# Patient Record
Sex: Male | Born: 2000 | Race: White | Hispanic: No | Marital: Single | Smoking: Never smoker
Health system: Southern US, Community
[De-identification: ages and names within clinical notes are randomized; demographics above are authoritative.]

---
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
## 2021-03-01 ENCOUNTER — Other Ambulatory Visit: Payer: Self-pay

## 2021-03-01 ENCOUNTER — Encounter: Payer: Self-pay | Admitting: Emergency Medicine

## 2021-03-01 ENCOUNTER — Emergency Department
Admission: EM | Admit: 2021-03-01 | Discharge: 2021-03-01 | Disposition: A | Discharge status: Home / Self Care | Payer: BC Managed Care – PPO | Attending: Emergency Medicine | Admitting: Emergency Medicine

## 2021-03-01 DIAGNOSIS — H16133 Photokeratitis, bilateral: Secondary | ICD-10-CM

## 2021-03-01 DIAGNOSIS — H16131 Photokeratitis, right eye: Secondary | ICD-10-CM | POA: Diagnosis not present

## 2021-03-01 DIAGNOSIS — H16132 Photokeratitis, left eye: Secondary | ICD-10-CM | POA: Insufficient documentation

## 2021-03-01 DIAGNOSIS — H5712 Ocular pain, left eye: Secondary | ICD-10-CM | POA: Diagnosis present

## 2021-03-01 MED ORDER — TETRACAINE HCL 0.5 % OP SOLN
2.0000 [drp] | Freq: Once | OPHTHALMIC | Status: AC
  Administered 2021-03-01: 2 [drp] via OPHTHALMIC
  Filled 2021-03-01: qty 4

## 2021-03-01 MED ORDER — OXYCODONE-ACETAMINOPHEN 5-325 MG PO TABS
2.0000 | ORAL_TABLET | Freq: Once | ORAL | Status: AC
  Administered 2021-03-01: 2 via ORAL
  Filled 2021-03-01: qty 2

## 2021-03-01 MED ORDER — ERYTHROMYCIN 5 MG/GM OP OINT
TOPICAL_OINTMENT | OPHTHALMIC | Status: AC
  Administered 2021-03-01: 1 via OPHTHALMIC
  Filled 2021-03-01: qty 1

## 2021-03-01 MED ORDER — OXYCODONE-ACETAMINOPHEN 5-325 MG PO TABS: 2.0000 | ORAL_TABLET | Freq: Four times a day (QID) | ORAL | 0 refills | Status: DC | PRN

## 2021-03-01 MED ORDER — FLUORESCEIN SODIUM 1 MG OP STRP
1.0000 | ORAL_STRIP | Freq: Once | OPHTHALMIC | Status: AC
  Administered 2021-03-01: 1 via OPHTHALMIC
  Filled 2021-03-01: qty 1

## 2021-03-01 MED ORDER — ERYTHROMYCIN 5 MG/GM OP OINT: TOPICAL_OINTMENT | OPHTHALMIC | 0 refills | Status: AC

## 2021-03-01 NOTE — ED Provider Notes (Signed)
[facility] Provider Note    Event Date/Time   First MD Initiated Contact with Patient 03/01/21 0153     (approximate)   History   Eye Problem   HPI  Albert Nichols is a 21 y.o. male who reports no chronic medical issues and presents for evaluation of bilateral severe eye pain, redness, and foreign body sensation worse on the right than the left.  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  He felt fine at the time but several hours later he developed severe pain which has become worse and is almost unbearable.  Nothing in particular makes it better or worse although closing his eyes helps a little bit.  He does not remember getting anything in his eyes but he is worried that something may have gotten into his right eye.  No nausea or vomiting, no trauma.     Physical Exam   Triage Vital Signs: ED Triage Vitals  Enc Vitals Group     BP 03/01/21 0147 (!) 107/56     Pulse Rate 03/01/21 0147 71     Resp 03/01/21 0147 16     Temp 03/01/21 0147 98.2 F (36.8 C)     Temp src --      SpO2 03/01/21 0147 98 %     Weight 03/01/21 0140 65.8 kg (145 lb)     Height 03/01/21 0140 1.702 m (5\' 7" )     Head Circumference --      Peak Flow --      Pain Score 03/01/21 0143 10     Pain Loc --      Pain Edu? --      Excl. in [place]? --     Most recent vital signs: Vitals:   03/01/21 0147  BP: (!) 107/56  Pulse: 71  Resp: 16  Temp: 98.2 F (36.8 C)  SpO2: 98%     General: Awake, no distress although he appears a bit uncomfortable. Eyes:  Bilateral conjunctival injection.  Pupils are equal and reactive.  Examination with Woods lamp magnification under normal light and under black light after fluorescein administration reveals no foreign bodies, no corneal abrasions, no corneal ulcers. CV:  Good peripheral perfusion.  Resp:  Normal effort.    ED  Results / Procedures / Treatments    MEDICATIONS ORDERED IN ED: Medications  tetracaine (PONTOCAINE) 0.5 % ophthalmic solution 2 drop (2 drops Both Eyes Given by Other 03/01/21 0245)  fluorescein ophthalmic strip 1 strip (1 strip Both Eyes Given by Other 03/01/21 0245)  oxyCODONE-acetaminophen (PERCOCET/ROXICET) 5-325 MG per tablet 2 tablet (2 tablets Oral Given 03/01/21 0317)  erythromycin ophthalmic ointment (1 application Both Eyes Given 03/01/21 0318)     IMPRESSION / MDM / ASSESSMENT AND PLAN / ED COURSE  I reviewed the triage vital signs and the nursing notes.                              Differential diagnosis includes, but is not limited to, photokeratitis, conjunctivitis, uveitis/iritis, foreign body.  History does not support foreign body but does support photokeratitis.  Vital signs are stable.  Patient is obviously uncomfortable but felt great relief after tetracaine administration.  I also provided to oxycodone by mouth.  No evidence of corneal abrasion.  Patient has photokeratitis and I provided erythromycin ophthalmic  ointment and recommended close outpatient follow-up at the [facility].  I had my usual and customary photokeratitis discussion with the patient and his partner (girlfriend or wife) and they will follow-up as an outpatient.  I given work note.  I gave my usual and customary return precautions.           FINAL CLINICAL IMPRESSION(S) / ED DIAGNOSES   Final diagnoses:  Photokeratitis of both eyes     Rx / DC Orders   ED Discharge Orders          Ordered    erythromycin ophthalmic ointment        03/01/21 0336    oxyCODONE-acetaminophen (PERCOCET) 5-325 MG tablet  Every 6 hours PRN        03/01/21 0336             Note:  This document was prepared using Dragon voice recognition software and may include unintentional dictation errors.   Hinda Kehr, MD 03/01/21 [phone]

## 2021-03-01 NOTE — ED Triage Notes (Signed)
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

## 2021-03-01 NOTE — Discharge Instructions (Signed)
Please use the provided eye ointment up to 4 times a day for comfort.  Continue using it for about 3 days or until your pain is better.  You can also use over-the-counter saline drops (make sure there are no preservatives or other ingredients).  Use over-the-counter ibuprofen and/or Tylenol for pain control, or Take Perococet as prescribed for severe pain. Do not drink alcohol, drive or participate in any other potentially dangerous activities while taking this medication as it may make you sleepy. Do not take this medication with any other sedating medications, either prescription or over-the-counter. If you were prescribed Percocet or Vicodin, do not take these with acetaminophen (Tylenol) as it is already contained within these medications.   This medication is an opiate (or narcotic) pain medication and can be habit forming.  Use it as little as possible to achieve adequate pain control.  Do not use or use it with extreme caution if you have a history of opiate abuse or dependence.  If you are on a pain contract with your primary care doctor or a pain specialist, be sure to let them know you were prescribed this medication today from the [facility] Emergency Department.  This medication is intended for your use only - do not give any to anyone else and keep it in a secure place where nobody else, especially children, have access to it.  It will also cause or worsen constipation, so you may want to consider taking an over-the-counter stool softener while you are taking this medication.  It is important that you call in the morning to schedule a follow-up appointment at the [facility] with Dr. Druscilla Brownie or one of his colleagues.  You should be feeling much better within 1 to 3 days, but it is important to have an expert to look at your eyes as well.

## 2021-11-27 ENCOUNTER — Other Ambulatory Visit: Payer: Self-pay

## 2021-11-27 ENCOUNTER — Ambulatory Visit (HOSPITAL_COMMUNITY)
Admission: EM | Admit: 2021-11-27 | Discharge: 2021-11-27 | Disposition: A | Discharge status: Home / Self Care | Payer: Medicaid Other | Attending: Emergency Medicine | Admitting: Emergency Medicine

## 2021-11-27 ENCOUNTER — Encounter (HOSPITAL_COMMUNITY): Payer: Self-pay | Admitting: *Deleted

## 2021-11-27 DIAGNOSIS — S29012A Strain of muscle and tendon of back wall of thorax, initial encounter: Secondary | ICD-10-CM | POA: Diagnosis not present

## 2021-11-27 MED ORDER — KETOROLAC TROMETHAMINE 30 MG/ML IJ SOLN
30.0000 mg | Freq: Once | INTRAMUSCULAR | Status: AC
  Administered 2021-11-27: 30 mg via INTRAMUSCULAR

## 2021-11-27 MED ORDER — PREDNISONE 20 MG PO TABS: 20.0000 mg | ORAL_TABLET | Freq: Every day | ORAL | 0 refills | Status: AC

## 2021-11-27 MED ORDER — TIZANIDINE HCL 4 MG PO TABS: 4.0000 mg | ORAL_TABLET | Freq: Four times a day (QID) | ORAL | 0 refills | Status: AC | PRN

## 2021-11-27 MED ORDER — KETOROLAC TROMETHAMINE 30 MG/ML IJ SOLN
INTRAMUSCULAR | Status: AC
  Filled 2021-11-27: qty 1

## 2021-11-27 NOTE — ED Triage Notes (Signed)
Pt reports last week when he was driving home he got really mad and screamed. Pt reports he felt something pop in his upper back and has pain between his shoulder blades since then.

## 2021-11-27 NOTE — Discharge Instructions (Addendum)
I think you have a strain of the back muscle.  We have given you a Toradol injection in office today.  Zanaflex as a muscle relaxant has been sent to the pharmacy, you can take this every 6 hours as needed, please be mindful that this medication can make you sleepy, I would advise that you do not operate any heavy machinery or drive a car after taking this medication.  Prednisone, is a steroid this was sent to the pharmacy, you will take this medication in the morning for the next 5 days.  This medication can cause you to have difficulty sleeping at night so I advised that you take this medication first thing in the morning.   I advised that you stay home rest for the next couple days.  I have attached information in the back of your paperwork for how you can use ice on the site of pain.  I have attached information to your discharge paperwork for [facility] in the case that symptoms do not improve, you can follow-up with this office.

## 2021-11-27 NOTE — ED Provider Notes (Signed)
[facility]    CSN: [id number] Arrival date & time: 11/27/21  9794      History   Chief Complaint Chief Complaint  Patient presents with   Back Pain    HPI Albert Nichols is a 21 y.o. male.  Patient presents complaining of left upper back pain x 1 week.  Patient denies any fall or trauma .  Patient denies any change to bowel or bladder patterns. Patient reports onset of symptoms began after yelling in a car and then experiencing sharp pain.  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  Patient reports after working this weekend pain became worsened.  Patient has taken an over-the-counter anti-inflammatory with no relief of symptoms.    Back Pain Associated symptoms: no chest pain, no fever, no numbness and no weakness     History reviewed. No pertinent past medical history.  There are no problems to display for this patient.   History reviewed. No pertinent surgical history.     Home Medications    Prior to Admission medications   Medication Sig Start Date End Date Taking? Authorizing Provider  predniSONE (DELTASONE) 20 MG tablet Take 1 tablet (20 mg total) by mouth daily. 11/27/21  Yes Debby Freiberg, NP  tiZANidine (ZANAFLEX) 4 MG tablet Take 1 tablet (4 mg total) by mouth every 6 (six) hours as needed for muscle spasms. 11/27/21  Yes Debby Freiberg, NP  erythromycin ophthalmic ointment Apply a thin strip of ointment (about 1/4") to the lower eyelid of each affected eye every 6 hours for 3 days or until your pain has resolved 03/01/21   Loleta Rose, MD  oxyCODONE-acetaminophen (PERCOCET) 5-325 MG tablet Take 2 tablets by mouth every 6 (six) hours as needed for severe pain. 03/01/21   Loleta Rose, MD    Family History History reviewed. No pertinent family history.  Social History Social History   Tobacco Use   Smoking status: Never   Smokeless tobacco: Never     Allergies   Patient has no  known allergies.   Review of Systems Review of Systems  Constitutional:  Positive for activity change (Due to pain). Negative for chills, fatigue and fever.  Respiratory: Negative.    Cardiovascular:  Negative for chest pain.  Gastrointestinal:  Negative for nausea and vomiting.  Genitourinary: Negative.   Musculoskeletal:  Positive for back pain and myalgias. Negative for arthralgias, gait problem, joint swelling, neck pain and neck stiffness.       LFT upper back pain   Neurological:  Negative for weakness and numbness.     Physical Exam Triage Vital Signs ED Triage Vitals  Enc Vitals Group     BP 11/27/21 1016 110/71     Pulse Rate 11/27/21 1016 (!) 48     Resp 11/27/21 1016 20     Temp 11/27/21 1016 97.8 F (36.6 C)     Temp src --      SpO2 11/27/21 1016 99 %     Weight --      Height --      Head Circumference --      Peak Flow --      Pain Score 11/27/21 1012 10     Pain Loc --      Pain Edu? --      Excl. in [place]? --    No data found.  Updated Vital Signs BP 110/71   Pulse (!) 48  Temp 97.8 F (36.6 C)   Resp 20   SpO2 99%      Physical Exam Vitals and nursing note reviewed.  Musculoskeletal:     Left shoulder: Normal range of motion.       Arms:     Cervical back: Spasms and tenderness present. No swelling, edema, deformity, erythema, rigidity, torticollis or bony tenderness. Pain with movement present. Normal range of motion.     Thoracic back: Normal.     Lumbar back: Normal.       Back:     Comments: Sharp pain localized in region, tenderness upon palpation of LFT upper back , under scapula. Pain reproduced in LFT upper back under scapula with abduction and flexion of LFT shoulder.   Neurological:     General: No focal deficit present.     Mental Status: He is alert.     Motor: Motor function is intact.  Psychiatric:        Behavior: Behavior is cooperative.      UC Treatments / Results  Labs (all labs ordered are listed, but only  abnormal results are displayed) Labs Reviewed - No data to display  EKG   Radiology No results found.  Procedures Procedures (including critical care time)  Medications Ordered in UC Medications  ketorolac (TORADOL) 30 MG/ML injection 30 mg (30 mg Intramuscular Given 11/27/21 1135)    Initial Impression / Assessment and Plan / UC Course  I have reviewed the triage vital signs and the nursing notes.  Pertinent labs & imaging results that were available during my care of the patient were reviewed by me and considered in my medical decision making (see chart for details).     Patient was treated for muscle sprain in the left upper back.  Toradol injection was given in office.  Shared decision-making was used to determine an x-ray was not necessary at this time due to symptomology and mechanism of injury.  Zanaflex and prednisone prescription was sent to pharmacy.  Patient was made aware of possible side effects with these medications.  [facility] information was given in the case that symptoms do not improve.  Patient was made aware to rest back for the next couple days.  Patient verbalized understanding of instructions.  Final Clinical Impressions(s) / UC Diagnoses   Final diagnoses:  Muscle strain of left upper back, initial encounter     Discharge Instructions      I think you have a strain of the back muscle.  We have given you a Toradol injection in office today.  Zanaflex as a muscle relaxant has been sent to the pharmacy, you can take this every 6 hours as needed, please be mindful that this medication can make you sleepy, I would advise that you do not operate any heavy machinery or drive a car after taking this medication.  Prednisone, is a steroid this was sent to the pharmacy, you will take this medication in the morning for the next 5 days.  This medication can cause you to have difficulty sleeping at night so I advised that you take this medication first thing in the  morning.   I advised that you stay home rest for the next couple days.  I have attached information in the back of your paperwork for how you can use ice on the site of pain.  I have attached information to your discharge paperwork for [facility] in the case that symptoms do not improve, you can follow-up with this office.  ED Prescriptions     Medication Sig Dispense Auth. Provider   tiZANidine (ZANAFLEX) 4 MG tablet Take 1 tablet (4 mg total) by mouth every 6 (six) hours as needed for muscle spasms. 30 tablet Debby Freiberg, NP   predniSONE (DELTASONE) 20 MG tablet Take 1 tablet (20 mg total) by mouth daily. 5 tablet Debby Freiberg, NP      PDMP not reviewed this encounter.   Debby Freiberg, NP 11/27/21 1151

## 2021-12-08 ENCOUNTER — Emergency Department (HOSPITAL_COMMUNITY)
Admission: EM | Admit: 2021-12-08 | Discharge: 2021-12-09 | Disposition: A | Discharge status: Home / Self Care | Payer: 59 | Attending: Emergency Medicine | Admitting: Emergency Medicine

## 2021-12-08 ENCOUNTER — Encounter (HOSPITAL_COMMUNITY): Payer: Self-pay | Admitting: Emergency Medicine

## 2021-12-08 ENCOUNTER — Emergency Department (HOSPITAL_COMMUNITY): Payer: 59

## 2021-12-08 ENCOUNTER — Other Ambulatory Visit: Payer: Self-pay

## 2021-12-08 DIAGNOSIS — N451 Epididymitis: Secondary | ICD-10-CM | POA: Insufficient documentation

## 2021-12-08 DIAGNOSIS — N50812 Left testicular pain: Secondary | ICD-10-CM | POA: Diagnosis present

## 2021-12-08 LAB — URINALYSIS, ROUTINE W REFLEX MICROSCOPIC
Bilirubin Urine: NEGATIVE
Glucose, UA: NEGATIVE mg/dL
Ketones, ur: NEGATIVE mg/dL
Nitrite: NEGATIVE
Protein, ur: 30 mg/dL — AB
Specific Gravity, Urine: 1.012 (ref 1.005–1.030)
WBC, UA: >50 WBC/hpf — ABNORMAL HIGH (ref 0–5)
pH: 8 (ref 5.0–8.0)

## 2021-12-08 NOTE — ED Provider Triage Note (Signed)
Emergency Medicine Provider Triage Evaluation Note  Albert Nichols , a 21 y.o. male  was evaluated in triage.  Pt complains of left testicular pain x 2 days. States he woke up with the pain and it has been worsening. Thinks he had some hematuria today.   Review of Systems  Positive: Testicular pain, hematuria Negative: Dysuria, fever, abd pain  Physical Exam  BP 131/67   Pulse 83   Temp 98.5 F (36.9 C)   Resp (!) 22   SpO2 100%  Gen:   Awake, no distress   Resp:  Normal effort  MSK:   Moves extremities without difficulty  Other:    Medical Decision Making  Medically screening exam initiated at 8:51 PM.  Appropriate orders placed.  Jessen Siegman was informed that the remainder of the evaluation will be completed by another provider, this initial triage assessment does not replace that evaluation, and the importance of remaining in the ED until their evaluation is complete.  Workup initiated including scrotal doppler US to r/o torsion   Halli Equihua T, PA-C 12/08/21 2051

## 2021-12-08 NOTE — ED Triage Notes (Signed)
Patient reports worsening left testicular pain onset Wednesday this week with hematuria today .

## 2021-12-09 DIAGNOSIS — N451 Epididymitis: Secondary | ICD-10-CM | POA: Diagnosis not present

## 2021-12-09 MED ORDER — CEFTRIAXONE SODIUM 500 MG IJ SOLR
500.0000 mg | Freq: Once | INTRAMUSCULAR | Status: AC
  Administered 2021-12-09: 500 mg via INTRAMUSCULAR
  Filled 2021-12-09: qty 500

## 2021-12-09 MED ORDER — LIDOCAINE HCL (PF) 1 % IJ SOLN
INTRAMUSCULAR | Status: AC
  Administered 2021-12-09: 1 mL
  Filled 2021-12-09: qty 5

## 2021-12-09 MED ORDER — DOXYCYCLINE HYCLATE 100 MG PO CAPS: 100.0000 mg | ORAL_CAPSULE | Freq: Two times a day (BID) | ORAL | 0 refills | Status: AC

## 2021-12-09 MED ORDER — DOXYCYCLINE HYCLATE 100 MG PO TABS
100.0000 mg | ORAL_TABLET | Freq: Once | ORAL | Status: AC
  Administered 2021-12-09: 100 mg via ORAL
  Filled 2021-12-09: qty 1

## 2021-12-09 NOTE — ED Provider Notes (Signed)
[facility] Emergency Department Provider Note MRN:  [id number]  Arrival date & time: 12/09/21     Chief Complaint   Left Testicle Pain / Hematuria   History of Present Illness   Albert Nichols. is a 21 y.o. year-old male presents to the ED with chief complaint of left testicle pain, dysuria, and penile discharge.  Noticed the symptoms 3 days ago.  Is sexually active, but denies new partners.  Denies fever or chills.  Denies any other associated symptoms.  History provided by patient.   Review of Systems  Pertinent positive and negative review of systems noted in HPI.    Physical Exam   Vitals:   12/09/21 0205 12/09/21 0442  BP:  126/74  Pulse:  70  Resp:  18  Temp: 99 F (37.2 C) 98.8 F (37.1 C)  SpO2:  100%    CONSTITUTIONAL:  well-appearing, NAD NEURO:  Alert and oriented x 3, CN 3-12 grossly intact EYES:  eyes equal and reactive ENT/NECK:  Supple, no stridor  CARDIO:  appears well-perfused  PULM:  No respiratory distress,  GI/GU:  non-distended, mild left testicular swelling and TTP, no masses, sores, or lesions MSK/SPINE:  No gross deformities, no edema, moves all extremities  SKIN:  no rash, atraumatic   *Additional and/or pertinent findings included in MDM below  Diagnostic and Interventional Summary     Labs Reviewed  URINALYSIS, ROUTINE W REFLEX MICROSCOPIC - Abnormal; Notable for the following components:      Result Value   APPearance CLOUDY (*)    Hgb urine dipstick SMALL (*)    Protein, ur 30 (*)    Leukocytes,Ua LARGE (*)    WBC, UA >50 (*)    Bacteria, UA RARE (*)    All other components within normal limits    US SCROTUM W/DOPPLER  Final Result      Medications  cefTRIAXone (ROCEPHIN) injection 500 mg (500 mg Intramuscular Given 12/09/21 0451)  doxycycline (VIBRA-TABS) tablet 100 mg (100 mg Oral Given 12/09/21 0451)  lidocaine (PF) (XYLOCAINE) 1 % injection (1 mL  Given 12/09/21 0452)      Procedures  /  Critical Care Procedures  ED Course and Medical Decision Making  I have reviewed the triage vital signs, the nursing notes, and pertinent available records from the EMR.  Social Determinants Affecting Complexity of Care: Patient has no clinically significant social determinants affecting this chief complaint..   ED Course:    Medical Decision Making Patient here with testicle pain.  [place] and UA ordered in triage consistent with epididymitis.  Likely 2/2 to gonorrhea or chlamydia.  Will treat with rocephin and doxy.  Recommend scrotal support.  No evidence of torsion.  Risk Prescription drug management.     Consultants: No consultations were needed in caring for this patient.   Treatment and Plan: Emergency department workup does not suggest an emergent condition requiring admission or immediate intervention beyond  what has been performed at this time. The patient is safe for discharge and has  been instructed to return immediately for worsening symptoms, change in  symptoms or any other concerns    Final Clinical Impressions(s) / ED Diagnoses     ICD-10-CM   1. Epididymitis  N45.1       ED Discharge Orders          Ordered    doxycycline (VIBRAMYCIN) 100 MG capsule  2 times daily        12/09/21 0505  Discharge Instructions Discussed with and Provided to Patient:   Discharge Instructions   None      Roxy Horseman, PA-C 12/09/21 3833    Mesner, Barbara Cower, MD 12/09/21 [phone]

## 2021-12-09 NOTE — ED Notes (Signed)
Patient refused vitals.

## 2022-10-26 ENCOUNTER — Emergency Department (HOSPITAL_COMMUNITY): Payer: 59

## 2022-10-26 ENCOUNTER — Emergency Department (HOSPITAL_COMMUNITY)
Admission: EM | Admit: 2022-10-26 | Discharge: 2022-10-26 | Disposition: A | Discharge status: Home / Self Care | Payer: 59 | Attending: Emergency Medicine | Admitting: Emergency Medicine

## 2022-10-26 ENCOUNTER — Other Ambulatory Visit: Payer: Self-pay

## 2022-10-26 DIAGNOSIS — Y92410 Unspecified street and highway as the place of occurrence of the external cause: Secondary | ICD-10-CM | POA: Diagnosis not present

## 2022-10-26 DIAGNOSIS — S80212A Abrasion, left knee, initial encounter: Secondary | ICD-10-CM | POA: Insufficient documentation

## 2022-10-26 DIAGNOSIS — R519 Headache, unspecified: Secondary | ICD-10-CM | POA: Diagnosis not present

## 2022-10-26 DIAGNOSIS — S161XXA Strain of muscle, fascia and tendon at neck level, initial encounter: Secondary | ICD-10-CM | POA: Diagnosis not present

## 2022-10-26 DIAGNOSIS — S20319A Abrasion of unspecified front wall of thorax, initial encounter: Secondary | ICD-10-CM | POA: Insufficient documentation

## 2022-10-26 DIAGNOSIS — S32030A Wedge compression fracture of third lumbar vertebra, initial encounter for closed fracture: Secondary | ICD-10-CM | POA: Diagnosis not present

## 2022-10-26 DIAGNOSIS — R079 Chest pain, unspecified: Secondary | ICD-10-CM | POA: Insufficient documentation

## 2022-10-26 DIAGNOSIS — S3992XA Unspecified injury of lower back, initial encounter: Secondary | ICD-10-CM | POA: Diagnosis present

## 2022-10-26 LAB — COMPREHENSIVE METABOLIC PANEL
ALT: 25 U/L (ref 0–44)
AST: 34 U/L (ref 15–41)
Albumin: 4.1 g/dL (ref 3.5–5.0)
Alkaline Phosphatase: 63 U/L (ref 38–126)
Anion gap: 9 (ref 5–15)
BUN: 14 mg/dL (ref 6–20)
CO2: 23 mmol/L (ref 22–32)
Calcium: 8.8 mg/dL — ABNORMAL LOW (ref 8.9–10.3)
Chloride: 105 mmol/L (ref 98–111)
Creatinine, Ser: 1.03 mg/dL (ref 0.61–1.24)
GFR, Estimated: >60 mL/min (ref >60)
Glucose, Bld: 101 mg/dL — ABNORMAL HIGH (ref 70–99)
Potassium: 3.6 mmol/L (ref 3.5–5.1)
Sodium: 137 mmol/L (ref 135–145)
Total Bilirubin: 1.1 mg/dL (ref 0.3–1.2)
Total Protein: 6.2 g/dL — ABNORMAL LOW (ref 6.5–8.1)

## 2022-10-26 LAB — CBC WITH DIFFERENTIAL/PLATELET
Abs Immature Granulocytes: 0.19 10*3/uL — ABNORMAL HIGH (ref 0.00–0.07)
Basophils Absolute: 0 10*3/uL (ref 0.0–0.1)
Basophils Relative: 0 %
Eosinophils Absolute: 0.1 10*3/uL (ref 0.0–0.5)
Eosinophils Relative: 2 %
HCT: 42.6 % (ref 39.0–52.0)
Hemoglobin: 15.2 g/dL (ref 13.0–17.0)
Immature Granulocytes: 3 %
Lymphocytes Relative: 26 %
Lymphs Abs: 1.9 10*3/uL (ref 0.7–4.0)
MCH: 31.4 pg (ref 26.0–34.0)
MCHC: 35.7 g/dL (ref 30.0–36.0)
MCV: 88 fL (ref 80.0–100.0)
Monocytes Absolute: 0.4 10*3/uL (ref 0.1–1.0)
Monocytes Relative: 5 %
Neutro Abs: 4.9 10*3/uL (ref 1.7–7.7)
Neutrophils Relative %: 64 %
Platelets: 207 10*3/uL (ref 150–400)
RBC: 4.84 MIL/uL (ref 4.22–5.81)
RDW: 11.9 % (ref 11.5–15.5)
WBC: 7.5 10*3/uL (ref 4.0–10.5)
nRBC: 0 % (ref 0.0–0.2)

## 2022-10-26 MED ORDER — IBUPROFEN 800 MG PO TABS: 800.0000 mg | ORAL_TABLET | Freq: Three times a day (TID) | ORAL | 0 refills | Status: DC | PRN

## 2022-10-26 MED ORDER — KETOROLAC TROMETHAMINE 30 MG/ML IJ SOLN
30.0000 mg | Freq: Once | INTRAMUSCULAR | Status: AC
  Administered 2022-10-26: 30 mg via INTRAVENOUS
  Filled 2022-10-26: qty 1

## 2022-10-26 MED ORDER — OXYCODONE-ACETAMINOPHEN 5-325 MG PO TABS
1.0000 | ORAL_TABLET | Freq: Four times a day (QID) | ORAL | 0 refills | Status: DC | PRN
Start: 2022-10-26 — End: 2022-10-29

## 2022-10-26 MED ORDER — IOHEXOL 350 MG/ML SOLN
75.0000 mL | Freq: Once | INTRAVENOUS | Status: AC | PRN
  Administered 2022-10-26: 75 mL via INTRAVENOUS

## 2022-10-26 MED ORDER — SODIUM CHLORIDE 0.9 % IV BOLUS
500.0000 mL | Freq: Once | INTRAVENOUS | Status: AC
  Administered 2022-10-26: 500 mL via INTRAVENOUS

## 2022-10-26 MED ORDER — DICLOFENAC SODIUM 1 % EX GEL: 2.0000 g | Freq: Four times a day (QID) | CUTANEOUS | 0 refills | Status: DC

## 2022-10-26 MED ORDER — MORPHINE SULFATE (PF) 4 MG/ML IV SOLN
4.0000 mg | INTRAVENOUS | Status: DC | PRN
  Administered 2022-10-26 (×2): 4 mg via INTRAVENOUS
  Filled 2022-10-26 (×2): qty 1

## 2022-10-26 MED ORDER — SENNOSIDES-DOCUSATE SODIUM 8.6-50 MG PO TABS: 1.0000 | ORAL_TABLET | Freq: Every evening | ORAL | 0 refills | Status: DC | PRN

## 2022-10-26 MED ORDER — ONDANSETRON HCL 4 MG/2ML IJ SOLN
4.0000 mg | Freq: Once | INTRAMUSCULAR | Status: AC
  Administered 2022-10-26: 4 mg via INTRAVENOUS
  Filled 2022-10-26: qty 2

## 2022-10-26 MED ORDER — METHOCARBAMOL 500 MG PO TABS: 500.0000 mg | ORAL_TABLET | Freq: Three times a day (TID) | ORAL | 0 refills | Status: DC | PRN

## 2022-10-26 NOTE — Progress Notes (Signed)
Orthopedic Tech Progress Note Patient Details:  Albert Nichols. November 07, 2000 [id number]  Ortho Devices Type of Ortho Device: Thoracolumbar corset (TLSO) Ortho Device/Splint Interventions: Ordered, Application, Adjustment   Post Interventions Patient Tolerated: Well Instructions Provided: Care of device, Adjustment of device  [place] A Gerilyn Pilgrim 10/26/2022, 11:44 AM
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
## 2022-10-26 NOTE — ED Notes (Signed)
C-collar removed per provider. Pt denies pain but "is a little stiff".
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
## 2022-10-29 ENCOUNTER — Telehealth (HOSPITAL_COMMUNITY): Payer: Self-pay | Admitting: Emergency Medicine

## 2022-10-29 MED ORDER — DICLOFENAC SODIUM 1 % EX GEL: 2.0000 g | Freq: Four times a day (QID) | CUTANEOUS | 0 refills | Status: AC

## 2022-10-29 MED ORDER — IBUPROFEN 800 MG PO TABS: 800.0000 mg | ORAL_TABLET | Freq: Three times a day (TID) | ORAL | 0 refills | Status: AC | PRN

## 2022-10-29 MED ORDER — SENNOSIDES-DOCUSATE SODIUM 8.6-50 MG PO TABS: 1.0000 | ORAL_TABLET | Freq: Every evening | ORAL | 0 refills | Status: AC | PRN

## 2022-10-29 MED ORDER — METHOCARBAMOL 500 MG PO TABS: 500.0000 mg | ORAL_TABLET | Freq: Three times a day (TID) | ORAL | 0 refills | Status: AC | PRN

## 2022-10-29 MED ORDER — OXYCODONE-ACETAMINOPHEN 5-325 MG PO TABS: 1.0000 | ORAL_TABLET | Freq: Four times a day (QID) | ORAL | 0 refills | Status: AC | PRN

## 2022-10-29 NOTE — Telephone Encounter (Signed)
Social work received a call from the patient's pharmacy reportedly had not received a prescription for any of the medications that were prescribed.  Interestingly this visit was from 3 days ago.  Will prescribe the medications in question with the exception I will prescribe less of the narcotic tablets.
